# Patient Record
Sex: Male | Born: 1990 | Race: White | Hispanic: No | Smoking: Current every day smoker
Health system: Southern US, Community
[De-identification: ages and names within clinical notes are randomized; demographics above are authoritative.]

## PROBLEM LIST (undated history)

## (undated) DIAGNOSIS — L0591 Pilonidal cyst without abscess: Secondary | ICD-10-CM

## (undated) DIAGNOSIS — Z87820 Personal history of traumatic brain injury: Secondary | ICD-10-CM

## (undated) HISTORY — PX: TOOTH EXTRACTION: SHX859

---

## 2010-08-28 DIAGNOSIS — Z87820 Personal history of traumatic brain injury: Secondary | ICD-10-CM

## 2010-08-28 HISTORY — DX: Personal history of traumatic brain injury: Z87.820

## 2013-04-25 ENCOUNTER — Ambulatory Visit (INDEPENDENT_AMBULATORY_CARE_PROVIDER_SITE_OTHER): Payer: 59 | Admitting: Surgery

## 2013-04-25 ENCOUNTER — Encounter (INDEPENDENT_AMBULATORY_CARE_PROVIDER_SITE_OTHER): Payer: Self-pay | Admitting: Surgery

## 2013-04-25 VITALS — BP 130/78 | HR 78 | Temp 98.4°F | Resp 15 | Ht 73.0 in | Wt 181.4 lb

## 2013-04-25 DIAGNOSIS — R590 Localized enlarged lymph nodes: Secondary | ICD-10-CM | POA: Insufficient documentation

## 2013-04-25 DIAGNOSIS — R599 Enlarged lymph nodes, unspecified: Secondary | ICD-10-CM

## 2013-04-25 DIAGNOSIS — L0591 Pilonidal cyst without abscess: Secondary | ICD-10-CM

## 2013-04-25 NOTE — Progress Notes (Signed)
Patient ID: Austin Schmidt, male   DOB: 1990-10-20, 22 y.o.   MRN: [id number]  Chief Complaint  Patient presents with  . New Evaluation    eval cyst on bottom of back /  lymphnode at left neck    HPI Austin Schmidt is a 22 y.o. male.  Patient sent at the request of Dr. Sw[place]il[place] for pilonidal disease and enlarged left supraclavicular node.  Cyst drained 10 days ago and much better.  No cough or recent infections. No weight loss.  HPI  History reviewed. No pertinent past medical history.  History reviewed. No pertinent past surgical history.  History reviewed. No pertinent family history.  Social History History  Substance Use Topics  . Smoking status: Current Every Day Smoker -- 1.00 packs/day  . Smokeless tobacco: Never Used  . Alcohol Use: No    Not on File  Current Outpatient Prescriptions  Medication Sig Dispense Refill  . amoxicillin-clavulanate (AUGMENTIN) 125-31.25 MG/5ML suspension Take by mouth 2 (two) times daily.       No current facility-administered medications for this visit.    Review of Systems Review of Systems  Constitutional: Negative for fever.  HENT: Negative.   Eyes: Negative.   Respiratory: Negative.   Cardiovascular: Negative.   Gastrointestinal: Negative.   Endocrine: Negative.   Genitourinary: Negative.   Allergic/Immunologic: Negative.   Neurological: Negative.   Hematological: Positive for adenopathy.  Psychiatric/Behavioral: Negative.     Blood pressure 130/78, pulse 78, temperature 98.4 F (36.9 C), temperature source Temporal, resp. rate 15, height 6\' 1"  (1.854 m), weight 181 lb 6.4 oz (82.283 kg).  Physical Exam Physical Exam  Constitutional: He is oriented to person, place, and time. He appears well-developed and well-nourished.  HENT:  Head: Normocephalic and atraumatic.  Eyes: EOM are normal. Pupils are equal, round, and reactive to light.  Pulmonary/Chest: Effort normal.  Musculoskeletal: Normal range of motion.    Lymphadenopathy:    He has no cervical adenopathy.       Left: Supraclavicular adenopathy present.  Neurological: He is alert and oriented to person, place, and time.  Skin:     Psychiatric: He has a normal mood and affect. His behavior is normal. Judgment normal.    Data Reviewed Dr Virginia Crews notes  Assessment    Pilonidal cyst with history of infection Supraclavicular adenopathy left mild    Plan    Pilonidal cyst  Not infected Mild lymphadenopathy follow up next month       Austin Schmidt A. 04/25/2013, 11:34 AM

## 2013-04-25 NOTE — Patient Instructions (Signed)
Swollen Lymph Nodes The lymphatic system filters fluid from around cells. It is like a system of blood vessels. These channels carry lymph instead of blood. The lymphatic system is an important part of the immune (disease fighting) system. When people talk about "swollen glands in the neck," they are usually talking about swollen lymph nodes. The lymph nodes are like the little traps for infection. You and your caregiver may be able to feel lymph nodes, especially swollen nodes, in these common areas: the groin (inguinal area), armpits (axilla), and above the clavicle (supraclavicular). You may also feel them in the neck (cervical) and the back of the head just above the hairline (occipital). Swollen glands occur when there is any condition in which the body responds with an allergic type of reaction. For instance, the glands in the neck can become swollen from insect bites or any type of minor infection on the head. These are very noticeable in children with only minor problems. Lymph nodes may also become swollen when there is a tumor or problem with the lymphatic system, such as Hodgkin's disease. TREATMENT   Most swollen glands do not require treatment. They can be observed (watched) for a short period of time, if your caregiver feels it is necessary. Most of the time, observation is not necessary.  Antibiotics (medicines that kill germs) may be prescribed by your caregiver. Your caregiver may prescribe these if he or she feels the swollen glands are due to a bacterial (germ) infection. Antibiotics are not used if the swollen glands are caused by a virus. HOME CARE INSTRUCTIONS   Take medications as directed by your caregiver. Only take over-the-counter or prescription medicines for pain, discomfort, or fever as directed by your caregiver. SEEK MEDICAL CARE IF:   If you begin to run a temperature greater than 102 F (38.9 C), or as your caregiver suggests. MAKE SURE YOU:   Understand these  instructions.  Will watch your condition.  Will get help right away if you are not doing well or get worse. Document Released: 08/04/2002 Document Revised: 11/06/2011 Document Reviewed: 08/14/2005 [facility] Patient Information 2014 [place], [place]. Pilonidal Cyst A pilonidal cyst occurs when hairs get trapped (ingrown) beneath the skin in the crease between the buttocks over your sacrum (the bone under that crease). Pilonidal cysts are most common in young men with a lot of body hair. When the cyst is ruptured (breaks) or leaking, fluid from the cyst may cause burning and itching. If the cyst becomes infected, it causes a painful swelling filled with pus (abscess). The pus and trapped hairs need to be removed (often by lancing) so that the infection can heal. However, recurrence is common and an operation may be needed to remove the cyst. HOME CARE INSTRUCTIONS   If the cyst was NOT INFECTED:  Keep the area clean and dry. Bathe or shower daily. Wash the area well with a germ-killing soap. Warm tub baths may help prevent infection and help with drainage. Dry the area well with a towel.  Avoid tight clothing to keep area as moisture free as possible.  Keep area between buttocks as free of hair as possible. A depilatory may be used.  If the cyst WAS INFECTED and needed to be drained:  Your caregiver packed the wound with gauze to keep the wound open. This allows the wound to heal from the inside outwards and continue draining.  Return for a wound check in 1 day or as suggested.  If you take tub baths  or showers, repack the wound with gauze following them. Sponge baths (at the sink) are a good alternative.  If an antibiotic was ordered to fight the infection, take as directed.  Only take over-the-counter or prescription medicines for pain, discomfort, or fever as directed by your caregiver.  After the drain is removed, use sitz baths for 20 minutes 4 times per day. Clean the wound gently  with mild unscented soap, pat dry, and then apply a dry dressing. SEEK MEDICAL CARE IF:   You have increased pain, swelling, redness, drainage, or bleeding from the area.  You have a fever.  You have muscles aches, dizziness, or a general ill feeling. Document Released: 08/11/2000 Document Revised: 11/06/2011 Document Reviewed: 10/09/2008 [facility] Patient Information 2014 [place], [place].

## 2013-05-22 ENCOUNTER — Encounter (INDEPENDENT_AMBULATORY_CARE_PROVIDER_SITE_OTHER): Payer: Self-pay | Admitting: Surgery

## 2013-05-22 ENCOUNTER — Ambulatory Visit (INDEPENDENT_AMBULATORY_CARE_PROVIDER_SITE_OTHER): Payer: 59 | Admitting: Surgery

## 2013-05-22 VITALS — BP 110/76 | HR 64 | Temp 98.0°F | Resp 16 | Ht 73.0 in | Wt 179.6 lb

## 2013-05-22 DIAGNOSIS — L0591 Pilonidal cyst without abscess: Secondary | ICD-10-CM

## 2013-05-22 DIAGNOSIS — R599 Enlarged lymph nodes, unspecified: Secondary | ICD-10-CM

## 2013-05-22 DIAGNOSIS — R590 Localized enlarged lymph nodes: Secondary | ICD-10-CM

## 2013-05-22 NOTE — Patient Instructions (Signed)
Stop smoking.  Return 6 weeks for recheck of node and pilonidal.

## 2013-05-22 NOTE — Progress Notes (Signed)
Patient ID: Austin Schmidt, male   DOB: 02-10-1991, 22 y.o.   MRN: [id number]  Chief Complaint  Patient presents with  . Follow-up    reck cyst ?pilo    HPI Austin Schmidt is a 22 y.o. male.  Patient sent at the request of Dr. Sw[place] for pilonidal disease and enlarged left supraclavicular node.  Cyst drained 10 days ago and much better.  No cough or recent infections. No weight loss.  Here for 1 month follow up.  No new complaints. HPI  History reviewed. No pertinent past medical history.  History reviewed. No pertinent past surgical history.  History reviewed. No pertinent family history.  Social History History  Substance Use Topics  . Smoking status: Current Every Day Smoker -- 1.00 packs/day  . Smokeless tobacco: Never Used  . Alcohol Use: No    No Known Allergies  No current outpatient prescriptions on file.   No current facility-administered medications for this visit.    Review of Systems Review of Systems  Constitutional: Negative for fever.  HENT: Negative.   Eyes: Negative.   Respiratory: Negative.   Cardiovascular: Negative.   Gastrointestinal: Negative.   Endocrine: Negative.   Genitourinary: Negative.   Allergic/Immunologic: Negative.   Neurological: Negative.   Hematological: Positive for adenopathy.  Psychiatric/Behavioral: Negative.     Blood pressure 110/76, pulse 64, temperature 98 F (36.7 C), temperature source Oral, resp. rate 16, height 6\' 1"  (1.854 m), weight 179 lb 9.6 oz (81.466 kg).  Physical Exam Physical Exam  Constitutional: He is oriented to person, place, and time. He appears well-developed and well-nourished.  HENT:  Head: Normocephalic and atraumatic.  Eyes: EOM are normal. Pupils are equal, round, and reactive to light.  Pulmonary/Chest: Effort normal.  Musculoskeletal: Normal range of motion.  Lymphadenopathy:    He has no cervical adenopathy.       Left: Supraclavicular adenopathy present.  1 cm in size stable to  slightly smaller. Neurological: He is alert and oriented to person, place, and time.  Skin:     Psychiatric: He has a normal mood and affect. His behavior is normal. Judgment normal.    Data Reviewed Dr Virginia Crews notes  Assessment    Pilonidal cyst with history of infection Supraclavicular adenopathy left mild    Plan    Pilonidal cyst  Not infected Mild lymphadenopathy mild improvement in size.   Recheck in 6 weeks. Recommend smoking cessation if he wants pilonidal removed.         Austin Schmidt A. 05/22/2013, 2:47 PM

## 2013-06-30 ENCOUNTER — Ambulatory Visit (INDEPENDENT_AMBULATORY_CARE_PROVIDER_SITE_OTHER): Payer: 59 | Admitting: Surgery

## 2013-06-30 ENCOUNTER — Encounter (INDEPENDENT_AMBULATORY_CARE_PROVIDER_SITE_OTHER): Payer: Self-pay | Admitting: Surgery

## 2013-06-30 VITALS — BP 110/64 | HR 72 | Temp 97.6°F | Resp 14 | Ht 73.0 in | Wt 182.4 lb

## 2013-06-30 DIAGNOSIS — L0591 Pilonidal cyst without abscess: Secondary | ICD-10-CM

## 2013-06-30 DIAGNOSIS — R599 Enlarged lymph nodes, unspecified: Secondary | ICD-10-CM

## 2013-06-30 DIAGNOSIS — R590 Localized enlarged lymph nodes: Secondary | ICD-10-CM

## 2013-06-30 MED ORDER — DOXYCYCLINE HYCLATE 50 MG PO CAPS: 100.0000 mg | ORAL_CAPSULE | Freq: Two times a day (BID) | ORAL | Status: DC

## 2013-06-30 NOTE — Patient Instructions (Signed)
TAKE ANTIBIOTICS UNTIL GONE.  RETURN 1 MONTH.

## 2013-06-30 NOTE — Progress Notes (Signed)
Patient ID: Austin Schmidt, male   DOB: June 29, 1991, 22 y.o.   MRN: [id number]  Chief Complaint  Patient presents with  . Routine Post Op    reck pilo cyst    HPI Austin Schmidt is a 22 y.o. male.  Patient sent at the request of Dr. [place] for pilonidal disease and enlarged left supraclavicular node.  Cyst drained 10 days ago and much better.  No cough or recent infections. No weight loss.  Here for 1 month follow up.  No new complaints. HPI  History reviewed. No pertinent past medical history.  History reviewed. No pertinent past surgical history.  History reviewed. No pertinent family history.  Social History History  Substance Use Topics  . Smoking status: Former Smoker -- 1.00 packs/day    Quit date: 06/16/2013  . Smokeless tobacco: Never Used  . Alcohol Use: No    No Known Allergies  Current Outpatient Prescriptions  Medication Sig Dispense Refill  . doxycycline (VIBRAMYCIN) 50 MG capsule Take 2 capsules (100 mg total) by mouth 2 (two) times daily.  32 capsule  0   No current facility-administered medications for this visit.    Review of Systems Review of Systems  Constitutional: Negative for fever.  HENT: Negative.   Eyes: Negative.   Respiratory: Negative.   Cardiovascular: Negative.   Gastrointestinal: Negative.   Endocrine: Negative.   Genitourinary: Negative.   Allergic/Immunologic: Negative.   Neurological: Negative.   Hematological: Positive for adenopathy.  Psychiatric/Behavioral: Negative.     Blood pressure 110/64, pulse 72, temperature 97.6 F (36.4 C), temperature source Temporal, resp. rate 14, height 6\' 1"  (1.854 m), weight 182 lb 6.4 oz (82.736 kg).  Physical Exam Physical Exam  Constitutional: He is oriented to person, place, and time. He appears well-developed and well-nourished.  HENT:  Head: Normocephalic and atraumatic.  Eyes: EOM are normal. Pupils are equal, round, and reactive to light.  Pulmonary/Chest: Effort normal.   Musculoskeletal: Normal range of motion.  Lymphadenopathy:    He has no cervical adenopathy.       Left: Supraclavicular adenopathy present.  1 cm in size stable to slightly smaller. Neurological: He is alert and oriented to person, place, and time.  Skin:     Psychiatric: He has a normal mood and affect. His behavior is normal. Judgment normal.    Data Reviewed Dr Virginia Crews notes  Assessment    Pilonidal cyst with history of infection Supraclavicular adenopathy left mild    Plan    Pilonidal cyst  Not infected Mild lymphadenopathy mild improvement in size.   Recheck in 4 weeks. Recommend smoking cessation if he wants pilonidal removed.         Hermelinda Diegel A. 06/30/2013, 2:57 PM

## 2013-07-28 ENCOUNTER — Encounter (INDEPENDENT_AMBULATORY_CARE_PROVIDER_SITE_OTHER): Payer: 59 | Admitting: Surgery

## 2013-08-05 ENCOUNTER — Encounter (INDEPENDENT_AMBULATORY_CARE_PROVIDER_SITE_OTHER): Payer: Self-pay | Admitting: Surgery

## 2013-08-05 ENCOUNTER — Ambulatory Visit (INDEPENDENT_AMBULATORY_CARE_PROVIDER_SITE_OTHER): Payer: 59 | Admitting: Surgery

## 2013-08-05 VITALS — BP 127/80 | HR 78 | Temp 99.1°F | Resp 18 | Ht 73.0 in | Wt 186.4 lb

## 2013-08-05 DIAGNOSIS — L0591 Pilonidal cyst without abscess: Secondary | ICD-10-CM

## 2013-08-05 NOTE — Patient Instructions (Signed)
Pilonidal Cyst A pilonidal cyst occurs when hairs get trapped (ingrown) beneath the skin in the crease between the buttocks over your sacrum (the bone under that crease). Pilonidal cysts are most common in young men with a lot of body hair. When the cyst is ruptured (breaks) or leaking, fluid from the cyst may cause burning and itching. If the cyst becomes infected, it causes a painful swelling filled with pus (abscess). The pus and trapped hairs need to be removed (often by lancing) so that the infection can heal. However, recurrence is common and an operation may be needed to remove the cyst. HOME CARE INSTRUCTIONS   If the cyst was NOT INFECTED:  Keep the area clean and dry. Bathe or shower daily. Wash the area well with a germ-killing soap. Warm tub baths may help prevent infection and help with drainage. Dry the area well with a towel.  Avoid tight clothing to keep area as moisture free as possible.  Keep area between buttocks as free of hair as possible. A depilatory may be used.  If the cyst WAS INFECTED and needed to be drained:  Your caregiver packed the wound with gauze to keep the wound open. This allows the wound to heal from the inside outwards and continue draining.  Return for a wound check in 1 day or as suggested.  If you take tub baths or showers, repack the wound with gauze following them. Sponge baths (at the sink) are a good alternative.  If an antibiotic was ordered to fight the infection, take as directed.  Only take over-the-counter or prescription medicines for pain, discomfort, or fever as directed by your caregiver.  After the drain is removed, use sitz baths for 20 minutes 4 times per day. Clean the wound gently with mild unscented soap, pat dry, and then apply a dry dressing. SEEK MEDICAL CARE IF:   You have increased pain, swelling, redness, drainage, or bleeding from the area.  You have a fever.  You have muscles aches, dizziness, or a general ill  feeling. Document Released: 08/11/2000 Document Revised: 11/06/2011 Document Reviewed: 10/09/2008 [employer] Patient Information 2014 [employer].  

## 2013-08-05 NOTE — Progress Notes (Signed)
Patient ID: Austin Schmidt, male   DOB: 02-Dec-1990, 21 y.o.   MRN: [id number]  Chief Complaint  Patient presents with  . Follow-up    HPI Austin Schmidt is a 22 y.o. male.  Patient sent at the request of Dr. [place] for pilonidal disease and enlarged left supraclavicular node.   No cough or recent infections. No weight loss.  Here for 1 month follow up.  No new complaints. Trying to stop smoking. No new complaints.  HPI  History reviewed. No pertinent past medical history.  History reviewed. No pertinent past surgical history.  History reviewed. No pertinent family history.  Social History History  Substance Use Topics  . Smoking status: Former Smoker -- 1.00 packs/day    Quit date: 06/16/2013  . Smokeless tobacco: Never Used  . Alcohol Use: No    No Known Allergies  No current outpatient prescriptions on file.   No current facility-administered medications for this visit.    Review of Systems Review of Systems  Constitutional: Negative for fever.  HENT: Negative.   Eyes: Negative.   Respiratory: Negative.   Cardiovascular: Negative.   Gastrointestinal: Negative.   Endocrine: Negative.   Genitourinary: Negative.   Allergic/Immunologic: Negative.   Neurological: Negative.   Hematological: Positive for adenopathy.  Psychiatric/Behavioral: Negative.     Blood pressure 127/80, pulse 78, temperature 99.1 F (37.3 C), temperature source Temporal, resp. rate 18, height 6\' 1"  (1.854 m), weight 186 lb 6.4 oz (84.55 kg).  Physical Exam Physical Exam  Constitutional: He is oriented to person, place, and time. He appears well-developed and well-nourished.  HENT:  Head: Normocephalic and atraumatic.  Eyes: EOM are normal. Pupils are equal, round, and reactive to light.  Pulmonary/Chest: Effort normal.  Musculoskeletal: Normal range of motion.  Lymphadenopathy:    He has no cervical adenopathy.       Left: Supraclavicular adenopathy resolved  0.5 cm in size stable to  slightly smaller. Neurological: He is alert and oriented to person, place, and time.  Skin:  2 x 3 cm pilonidal cyst not infected   Psychiatric: He has a normal mood and affect. His behavior is normal. Judgment normal.    Data Reviewed Dr Virginia Crews notes  Assessment    Pilonidal cyst with history of infection Supraclavicular adenopathy left mild    Plan    Pilonidal cyst  Not infected wants to set up surgery.  The procedure has been discussed with the patient.  Alternative therapies have been discussed with the patient.  Operative risks include bleeding,  Infection,  Organ injury,  Nerve injury,  Blood vessel injury,  DVT,  Pulmonary embolism,  Death, poor wound healing and reoperation.   Medical management risks include worsening of present situation.  The success of the procedure is 50 -90 % at treating patients symptoms.  The patient understands and agrees to proceed. Mild lymphadenopathy left supraclavicular area  resolved Recommend smoking cessation if he wants pilonidal removed.         Hollynn Garno A. 08/05/2013, 5:14 PM

## 2013-09-28 DIAGNOSIS — L0591 Pilonidal cyst without abscess: Secondary | ICD-10-CM

## 2013-09-28 HISTORY — DX: Pilonidal cyst without abscess: L05.91

## 2013-10-16 ENCOUNTER — Encounter (HOSPITAL_BASED_OUTPATIENT_CLINIC_OR_DEPARTMENT_OTHER): Payer: Self-pay | Admitting: *Deleted

## 2013-10-16 NOTE — Pre-Procedure Instructions (Signed)
To come for CBC, diff, CMET, CXR

## 2013-10-20 ENCOUNTER — Encounter (HOSPITAL_BASED_OUTPATIENT_CLINIC_OR_DEPARTMENT_OTHER)
Admission: RE | Admit: 2013-10-20 | Discharge: 2013-10-20 | Disposition: A | Discharge status: Home / Self Care | Payer: 59 | Source: Ambulatory Visit | Attending: Surgery | Admitting: Surgery

## 2013-10-20 ENCOUNTER — Other Ambulatory Visit (INDEPENDENT_AMBULATORY_CARE_PROVIDER_SITE_OTHER): Payer: Self-pay | Admitting: Surgery

## 2013-10-20 ENCOUNTER — Ambulatory Visit
Admission: RE | Admit: 2013-10-20 | Discharge: 2013-10-20 | Disposition: A | Discharge status: Home / Self Care | Payer: 59 | Source: Ambulatory Visit | Attending: Surgery | Admitting: Surgery

## 2013-10-20 DIAGNOSIS — Z01818 Encounter for other preprocedural examination: Secondary | ICD-10-CM

## 2013-10-20 LAB — CBC WITH DIFFERENTIAL/PLATELET
Basophils Absolute: 0 10*3/uL (ref 0.0–0.1)
Basophils Relative: 0 % (ref 0–1)
EOS ABS: 0.1 10*3/uL (ref 0.0–0.7)
EOS PCT: 2 % (ref 0–5)
HCT: 42.6 % (ref 39.0–52.0)
HEMOGLOBIN: 14.9 g/dL (ref 13.0–17.0)
LYMPHS ABS: 1.9 10*3/uL (ref 0.7–4.0)
Lymphocytes Relative: 34 % (ref 12–46)
MCH: 31.7 pg (ref 26.0–34.0)
MCHC: 35 g/dL (ref 30.0–36.0)
MCV: 90.6 fL (ref 78.0–100.0)
MONOS PCT: 8 % (ref 3–12)
Monocytes Absolute: 0.5 10*3/uL (ref 0.1–1.0)
Neutro Abs: 3.1 10*3/uL (ref 1.7–7.7)
Neutrophils Relative %: 55 % (ref 43–77)
Platelets: 234 10*3/uL (ref 150–400)
RBC: 4.7 MIL/uL (ref 4.22–5.81)
RDW: 12.6 % (ref 11.5–15.5)
WBC: 5.6 10*3/uL (ref 4.0–10.5)

## 2013-10-20 LAB — COMPREHENSIVE METABOLIC PANEL
ALT: 34 U/L (ref 0–53)
AST: 23 U/L (ref 0–37)
Albumin: 4.1 g/dL (ref 3.5–5.2)
Alkaline Phosphatase: 65 U/L (ref 39–117)
BUN: 12 mg/dL (ref 6–23)
CALCIUM: 9.6 mg/dL (ref 8.4–10.5)
CO2: 27 mEq/L (ref 19–32)
CREATININE: 0.94 mg/dL (ref 0.50–1.35)
Chloride: 106 mEq/L (ref 96–112)
GFR calc non Af Amer: >90 mL/min (ref >90)
GLUCOSE: 96 mg/dL (ref 70–99)
Potassium: 4.3 mEq/L (ref 3.7–5.3)
Sodium: 147 mEq/L (ref 137–147)
Total Bilirubin: 0.8 mg/dL (ref 0.3–1.2)
Total Protein: 7.3 g/dL (ref 6.0–8.3)

## 2013-10-21 ENCOUNTER — Encounter (HOSPITAL_BASED_OUTPATIENT_CLINIC_OR_DEPARTMENT_OTHER): Payer: 59 | Admitting: Certified Registered"

## 2013-10-21 ENCOUNTER — Encounter (HOSPITAL_BASED_OUTPATIENT_CLINIC_OR_DEPARTMENT_OTHER)
Admission: RE | Disposition: A | Discharge status: Home / Self Care | Payer: Self-pay | Source: Ambulatory Visit | Attending: Surgery

## 2013-10-21 ENCOUNTER — Ambulatory Visit (HOSPITAL_BASED_OUTPATIENT_CLINIC_OR_DEPARTMENT_OTHER)
Admission: RE | Admit: 2013-10-21 | Discharge: 2013-10-21 | Disposition: A | Discharge status: Home / Self Care | Payer: 59 | Source: Ambulatory Visit | Attending: Surgery | Admitting: Surgery

## 2013-10-21 ENCOUNTER — Ambulatory Visit (HOSPITAL_BASED_OUTPATIENT_CLINIC_OR_DEPARTMENT_OTHER): Payer: 59 | Admitting: Certified Registered"

## 2013-10-21 ENCOUNTER — Encounter (HOSPITAL_BASED_OUTPATIENT_CLINIC_OR_DEPARTMENT_OTHER): Payer: Self-pay | Admitting: Certified Registered"

## 2013-10-21 DIAGNOSIS — L0591 Pilonidal cyst without abscess: Secondary | ICD-10-CM | POA: Insufficient documentation

## 2013-10-21 DIAGNOSIS — Z87891 Personal history of nicotine dependence: Secondary | ICD-10-CM | POA: Insufficient documentation

## 2013-10-21 DIAGNOSIS — R599 Enlarged lymph nodes, unspecified: Secondary | ICD-10-CM | POA: Insufficient documentation

## 2013-10-21 HISTORY — PX: PILONIDAL CYST EXCISION: SHX744

## 2013-10-21 HISTORY — DX: Personal history of traumatic brain injury: Z87.820

## 2013-10-21 HISTORY — DX: Pilonidal cyst without abscess: L05.91

## 2013-10-21 SURGERY — EXCISION, SIMPLE PILONIDAL CYST
Anesthesia: General | Site: Coccyx

## 2013-10-21 MED ORDER — OXYCODONE-ACETAMINOPHEN 5-325 MG PO TABS: 1.0000 | ORAL_TABLET | ORAL | Status: DC | PRN

## 2013-10-21 MED ORDER — SUCCINYLCHOLINE CHLORIDE 20 MG/ML IJ SOLN
INTRAMUSCULAR | Status: DC | PRN
  Administered 2013-10-21: 120 mg via INTRAVENOUS

## 2013-10-21 MED ORDER — CEFAZOLIN SODIUM-DEXTROSE 2-3 GM-% IV SOLR
INTRAVENOUS | Status: AC
  Filled 2013-10-21: qty 50

## 2013-10-21 MED ORDER — CHLORHEXIDINE GLUCONATE 4 % EX LIQD: 1.0000 "application " | Freq: Once | CUTANEOUS | Status: DC

## 2013-10-21 MED ORDER — LIDOCAINE HCL (CARDIAC) 20 MG/ML IV SOLN
INTRAVENOUS | Status: DC | PRN
  Administered 2013-10-21: 60 mg via INTRAVENOUS

## 2013-10-21 MED ORDER — SUCCINYLCHOLINE CHLORIDE 20 MG/ML IJ SOLN
INTRAMUSCULAR | Status: AC
  Filled 2013-10-21: qty 1

## 2013-10-21 MED ORDER — HYDROMORPHONE HCL PF 1 MG/ML IJ SOLN
0.2500 mg | INTRAMUSCULAR | Status: DC | PRN
  Administered 2013-10-21 (×2): 0.5 mg via INTRAVENOUS

## 2013-10-21 MED ORDER — PROPOFOL 10 MG/ML IV BOLUS
INTRAVENOUS | Status: DC | PRN
Start: 2013-10-21 — End: 2013-10-21
  Administered 2013-10-21: 200 mg via INTRAVENOUS

## 2013-10-21 MED ORDER — MIDAZOLAM HCL 2 MG/ML PO SYRP: 12.0000 mg | ORAL_SOLUTION | Freq: Once | ORAL | Status: DC | PRN

## 2013-10-21 MED ORDER — FENTANYL CITRATE 0.05 MG/ML IJ SOLN
INTRAMUSCULAR | Status: AC
  Filled 2013-10-21: qty 4

## 2013-10-21 MED ORDER — CEFAZOLIN SODIUM-DEXTROSE 2-3 GM-% IV SOLR
INTRAVENOUS | Status: DC | PRN
  Administered 2013-10-21: 2 g via INTRAVENOUS

## 2013-10-21 MED ORDER — MIDAZOLAM HCL 5 MG/5ML IJ SOLN
INTRAMUSCULAR | Status: DC | PRN
  Administered 2013-10-21: 2 mg via INTRAVENOUS

## 2013-10-21 MED ORDER — HYDROMORPHONE HCL PF 1 MG/ML IJ SOLN
INTRAMUSCULAR | Status: AC
  Filled 2013-10-21: qty 1

## 2013-10-21 MED ORDER — OXYCODONE HCL 5 MG/5ML PO SOLN
5.0000 mg | Freq: Once | ORAL | Status: DC | PRN
Start: 2013-10-21 — End: 2013-10-21

## 2013-10-21 MED ORDER — LACTATED RINGERS IV SOLN
INTRAVENOUS | Status: DC
  Administered 2013-10-21: 13:00:00 via INTRAVENOUS

## 2013-10-21 MED ORDER — ONDANSETRON HCL 4 MG/2ML IJ SOLN
INTRAMUSCULAR | Status: DC | PRN
Start: 2013-10-21 — End: 2013-10-21
  Administered 2013-10-21: 4 mg via INTRAVENOUS

## 2013-10-21 MED ORDER — DEXTROSE 5 % IV SOLN: 3.0000 g | INTRAVENOUS | Status: DC

## 2013-10-21 MED ORDER — PROPOFOL 10 MG/ML IV EMUL
INTRAVENOUS | Status: AC
  Filled 2013-10-21: qty 50

## 2013-10-21 MED ORDER — FENTANYL CITRATE 0.05 MG/ML IJ SOLN
INTRAMUSCULAR | Status: DC | PRN
  Administered 2013-10-21: 100 ug via INTRAVENOUS
  Administered 2013-10-21 (×2): 50 ug via INTRAVENOUS

## 2013-10-21 MED ORDER — BACITRACIN-NEOMYCIN-POLYMYXIN OINTMENT TUBE
TOPICAL_OINTMENT | CUTANEOUS | Status: DC | PRN
  Administered 2013-10-21: 1 via TOPICAL

## 2013-10-21 MED ORDER — BUPIVACAINE-EPINEPHRINE PF 0.25-1:200000 % IJ SOLN
INTRAMUSCULAR | Status: AC
  Filled 2013-10-21: qty 30

## 2013-10-21 MED ORDER — BACITRACIN-NEOMYCIN-POLYMYXIN 400-5-5000 EX OINT
TOPICAL_OINTMENT | CUTANEOUS | Status: AC
  Filled 2013-10-21: qty 1

## 2013-10-21 MED ORDER — DEXAMETHASONE SODIUM PHOSPHATE 4 MG/ML IJ SOLN
INTRAMUSCULAR | Status: DC | PRN
  Administered 2013-10-21: 10 mg via INTRAVENOUS

## 2013-10-21 MED ORDER — MIDAZOLAM HCL 2 MG/2ML IJ SOLN
INTRAMUSCULAR | Status: AC
Start: 2013-10-21 — End: 2013-10-21
  Filled 2013-10-21: qty 2

## 2013-10-21 MED ORDER — PROMETHAZINE HCL 25 MG/ML IJ SOLN: 6.2500 mg | INTRAMUSCULAR | Status: DC | PRN

## 2013-10-21 MED ORDER — FENTANYL CITRATE 0.05 MG/ML IJ SOLN: 50.0000 ug | INTRAMUSCULAR | Status: DC | PRN

## 2013-10-21 MED ORDER — MIDAZOLAM HCL 2 MG/2ML IJ SOLN: 1.0000 mg | INTRAMUSCULAR | Status: DC | PRN

## 2013-10-21 MED ORDER — OXYCODONE HCL 5 MG PO TABS: 5.0000 mg | ORAL_TABLET | Freq: Once | ORAL | Status: DC | PRN

## 2013-10-21 MED ORDER — BUPIVACAINE-EPINEPHRINE 0.25% -1:200000 IJ SOLN
INTRAMUSCULAR | Status: DC | PRN
  Administered 2013-10-21: 20 mL

## 2013-10-21 SURGICAL SUPPLY — 49 items
BENZOIN TINCTURE PRP APPL 2/3 (GAUZE/BANDAGES/DRESSINGS) IMPLANT
BLADE HEX COATED 2.75 (ELECTRODE) ×3 IMPLANT
BLADE SURG 10 STRL SS (BLADE) IMPLANT
BLADE SURG 15 STRL LF DISP TIS (BLADE) ×1 IMPLANT
BLADE SURG 15 STRL SS (BLADE) ×2
BLADE SURG ROTATE 9660 (MISCELLANEOUS) ×3 IMPLANT
CANISTER SUCT 1200ML W/VALVE (MISCELLANEOUS) ×3 IMPLANT
CHLORAPREP W/TINT 26ML (MISCELLANEOUS) ×3 IMPLANT
COVER MAYO STAND STRL (DRAPES) ×3 IMPLANT
COVER TABLE BACK 60X90 (DRAPES) ×3 IMPLANT
DECANTER SPIKE VIAL GLASS SM (MISCELLANEOUS) IMPLANT
DERMABOND ADVANCED (GAUZE/BANDAGES/DRESSINGS)
DERMABOND ADVANCED .7 DNX12 (GAUZE/BANDAGES/DRESSINGS) IMPLANT
DRAPE LAPAROTOMY T 102X78X121 (DRAPES) ×3 IMPLANT
DRAPE PED LAPAROTOMY (DRAPES) ×3 IMPLANT
DRAPE UTILITY XL STRL (DRAPES) ×3 IMPLANT
ELECT REM PT RETURN 9FT ADLT (ELECTROSURGICAL) ×3
ELECTRODE REM PT RTRN 9FT ADLT (ELECTROSURGICAL) ×1 IMPLANT
GAUZE PACKING IODOFORM 1/4X15 (GAUZE/BANDAGES/DRESSINGS) ×3 IMPLANT
GLOVE BIO SURGEON STRL SZ 6.5 (GLOVE) ×2 IMPLANT
GLOVE BIO SURGEONS STRL SZ 6.5 (GLOVE) ×1
GLOVE BIOGEL PI IND STRL 7.0 (GLOVE) ×1 IMPLANT
GLOVE BIOGEL PI IND STRL 8 (GLOVE) ×1 IMPLANT
GLOVE BIOGEL PI IND STRL 8.5 (GLOVE) ×1 IMPLANT
GLOVE BIOGEL PI INDICATOR 7.0 (GLOVE) ×2
GLOVE BIOGEL PI INDICATOR 8 (GLOVE) ×2
GLOVE BIOGEL PI INDICATOR 8.5 (GLOVE) ×2
GLOVE ECLIPSE 8.0 STRL XLNG CF (GLOVE) ×3 IMPLANT
GOWN STRL REUS W/ TWL LRG LVL3 (GOWN DISPOSABLE) ×2 IMPLANT
GOWN STRL REUS W/TWL LRG LVL3 (GOWN DISPOSABLE) ×4
NEEDLE HYPO 25X1 1.5 SAFETY (NEEDLE) ×3 IMPLANT
NS IRRIG 1000ML POUR BTL (IV SOLUTION) ×3 IMPLANT
PACK BASIN DAY SURGERY FS (CUSTOM PROCEDURE TRAY) ×3 IMPLANT
PAD ABD 8X10 STRL (GAUZE/BANDAGES/DRESSINGS) ×3 IMPLANT
PENCIL BUTTON HOLSTER BLD 10FT (ELECTRODE) ×3 IMPLANT
SPONGE GAUZE 4X4 12PLY STER LF (GAUZE/BANDAGES/DRESSINGS) ×3 IMPLANT
SPONGE LAP 4X18 X RAY DECT (DISPOSABLE) ×3 IMPLANT
STAPLER VISISTAT 35W (STAPLE) IMPLANT
SUT MNCRL AB 3-0 PS2 18 (SUTURE) ×3 IMPLANT
SUT VIC AB 2-0 SH 27 (SUTURE) ×4
SUT VIC AB 2-0 SH 27XBRD (SUTURE) ×2 IMPLANT
SYR CONTROL 10ML LL (SYRINGE) ×3 IMPLANT
TAPE CLOTH 3X10 TAN LF (GAUZE/BANDAGES/DRESSINGS) ×3 IMPLANT
TOWEL OR 17X24 6PK STRL BLUE (TOWEL DISPOSABLE) ×3 IMPLANT
TOWEL OR NON WOVEN STRL DISP B (DISPOSABLE) IMPLANT
TUBE CONNECTING 20'X1/4 (TUBING) ×1
TUBE CONNECTING 20X1/4 (TUBING) ×2 IMPLANT
UNDERPAD 30X30 INCONTINENT (UNDERPADS AND DIAPERS) ×3 IMPLANT
YANKAUER SUCT BULB TIP NO VENT (SUCTIONS) ×3 IMPLANT

## 2013-10-21 NOTE — Anesthesia Preprocedure Evaluation (Signed)
Anesthesia Evaluation  Patient identified by MRN, date of birth, ID band Patient awake    Reviewed: Allergy & Precautions, H&P , NPO status , Patient's Chart, lab work & pertinent test results  History of Anesthesia Complications Negative for: history of anesthetic complications  Airway Mallampati: I  Neck ROM: Full    Dental no notable dental hx. (+) Teeth Intact   Pulmonary neg pulmonary ROS, Current Smoker,  breath sounds clear to auscultation  Pulmonary exam normal       Cardiovascular negative cardio ROS  IRhythm:Regular Rate:Normal     Neuro/Psych negative neurological ROS  negative psych ROS   GI/Hepatic negative GI ROS, Neg liver ROS,   Endo/Other  negative endocrine ROS  Renal/GU negative Renal ROS  negative genitourinary   Musculoskeletal   Abdominal   Peds  Hematology negative hematology ROS (+)   Anesthesia Other Findings   Reproductive/Obstetrics negative OB ROS                           Anesthesia Physical Anesthesia Plan  ASA: I  Anesthesia Plan: General   Post-op Pain Management:    Induction: Intravenous  Airway Management Planned: Oral ETT  Additional Equipment:   Intra-op Plan:   Post-operative Plan: Extubation in OR  Informed Consent: I have reviewed the patients History and Physical, chart, labs and discussed the procedure including the risks, benefits and alternatives for the proposed anesthesia with the patient or authorized representative who has indicated his/her understanding and acceptance.     Plan Discussed with: CRNA and Surgeon  Anesthesia Plan Comments:         Anesthesia Quick Evaluation

## 2013-10-21 NOTE — Transfer of Care (Signed)
Immediate Anesthesia Transfer of Care Note  Patient: Austin Schmidt  Procedure(s) Performed: Procedure(s): CYST EXCISION PILONIDAL SIMPLE (N/A)  Patient Location: PACU  Anesthesia Type:General  Level of Consciousness: sedated and patient cooperative  Airway & Oxygen Therapy: Patient Spontanous Breathing and Patient connected to face mask oxygen  Post-op Assessment: Report given to PACU RN and Post -op Vital signs reviewed and stable  Post vital signs: Reviewed and stable  Complications: No apparent anesthesia complications

## 2013-10-21 NOTE — H&P (Signed)
Transplants    None    Demographics Austin Schmidt 23 year old male  [address]  [facility] [place] [address] [phone] [name])  Comm Pref: None [address]  [place] [address] [phone] (M) Works at OTHER [[employer]  Problem ListHospitalization ProblemNon-Hospital  Pilonidal cyst  Lymphadenopathy, supraclavicular  Significant History/Details  Smoking: Current Every Day Smoker, .5 ppd, 2 pack-years  Smokeless Tobacco: Never Used  Alcohol: No  3 open orders  Preferred Language: English  Dialysis HistoryNone   Currently admitted as of 2/24/2015Specialty CommentsEditShow AllReport08/29/2014:MAY RELEASE MEDICAL INFO TO Austin \\RENA  Schmidt MOTHER,Austin Schmidt FATHER 08/05/13 pt will call back to schedule when has deposit placed in pending folder/et 10/01/13 left message for pt to call me back to schedule sx. skm DOS: 10/21/13 TC-[facility]-OP-exc pilonidal cyst/gen [id number] [facility]  10-09-13 pt op sx sch'd 10-21-13 @ [facility] no precert required per Charlcie Cradle. Ref# [id number]. (kh,tvp)   Medications (Discharged within 1 Day(s))Hospital Medications Outpatient Medications  ceFAZolin (ANCEF) 3 g in dextrose 5 % 50 mL IVPB  chlorhexidine (HIBICLENS) 4 % liquid 1 application  fentaNYL (SUBLIMAZE) injection 50-100 mcg  lactated ringers infusion  midazolam (VERSED) 2 MG/ML syrup 12 mg  midazolam (VERSED) injection 1-2 mg    Preferred Labs   None   Transplant-Related Biopsies (11 years) ** None **  Patient Blood Type (50 years)   None                                 Recent Visits (Maximum of 10 visits)Date Type Provider Description  10/21/2013 Surgery Arora Coakley A., MD   08/05/2013 Office Visit Austin Schmidt A., MD Pilonidal Cyst (Primary Dx)  06/30/2013 Office Visit Austin Schmidt A., MD Lymphadenopathy, Supraclavicular (Primary Dx); Pilonidal Cys...  05/22/2013 Office Visit Stan Cantave A., MD Lymphadenopathy, Supraclavicular (Primary Dx); Pilonidal Cys...   04/25/2013 Office Visit Robben Jagiello A., MD Pilonidal Cyst (Primary Dx); Lymphadenopathy, Supraclavicula...         My Last Outpatient Progress NoteStatus Last Austin Schmidt Encounter Date  Signed Tue Aug 05, 2013 5:19 PM EST 08/05/2013  Patient ID: Austin Schmidt, male   DOB: 1990-09-12, 23 y.o.   MRN: [id number]    Chief Complaint   Patient presents with   .  Follow-up      HPI Austin Schmidt is a 23 y.o. male.  Patient sent at the request of Dr. [place] for pilonidal disease and enlarged left supraclavicular node.   No cough or recent infections. No weight loss.  Here for 1 month follow up.  No new complaints. Trying to stop smoking. No new complaints.  HPI   History reviewed. No pertinent past medical history.   History reviewed. No pertinent past surgical history.   History reviewed. No pertinent family history.   Social History History   Substance Use Topics   .  Smoking status:  Former Smoker -- 1.00 packs/day       Quit date:  06/16/2013   .  Smokeless tobacco:  Never Used   .  Alcohol Use:  No      No Known Allergies    No current outpatient prescriptions on file.       No current facility-administered medications for this visit.      Review of Systems Review of Systems  Constitutional: Negative for fever.  HENT: Negative.   Eyes: Negative.   Respiratory: Negative.   Cardiovascular: Negative.   Gastrointestinal: Negative.  Endocrine: Negative.   Genitourinary: Negative.   Allergic/Immunologic: Negative.   Neurological: Negative.   Hematological: Positive for adenopathy.  Psychiatric/Behavioral: Negative.       Blood pressure 127/80, pulse 78, temperature 99.1 F (37.3 C), temperature source Temporal, resp. rate 18, height 6\' 1"  (1.854 m), weight 186 lb 6.4 oz (84.55 kg).   Physical Exam Physical Exam  Constitutional: He is oriented to person, place, and time. He appears well-developed and well-nourished.  HENT:   Head: Normocephalic and  atraumatic.  Eyes: EOM are normal. Pupils are equal, round, and reactive to light.  Pulmonary/Chest: Effort normal.  Musculoskeletal: Normal range of motion.  Lymphadenopathy:    He has no cervical adenopathy.       Left: Supraclavicular adenopathy resolved 0.5 cm in size stable to slightly smaller. Neurological: He is alert and oriented to person, place, and time.  Skin:  2 x 3 cm pilonidal cyst not infected  Psychiatric: He has a normal mood and affect. His behavior is normal. Judgment normal.      Data Reviewed Dr Virginia CrewsBetty Jordan's notes   Assessment Pilonidal cyst with history of infection Supraclavicular adenopathy left mild   Plan Pilonidal cyst  Not infected wants to set up surgery.  The procedure has been discussed with the patient.  Alternative therapies have been discussed with the patient.  Operative risks include bleeding,  Infection,  Organ injury,  Nerve injury,  Blood vessel injury,  DVT,  Pulmonary embolism,  Death, poor wound healing and reoperation.   Medical management risks include worsening of present situation.  The success of the procedure is 50 -90 % at treating patients symptoms.  The patient understands and agrees to proceed. Mild lymphadenopathy left supraclavicular area  resolved Recommend smoking cessation if he wants pilonidal removed.         Wylodean Shimmel A.

## 2013-10-21 NOTE — Interval H&P Note (Signed)
History and Physical Interval Note:  10/21/2013 1:27 PM  Austin Schmidt  has presented today for surgery, with the diagnosis of pilonidal cyst   The various methods of treatment have been discussed with the patient and family. After consideration of risks, benefits and other options for treatment, the patient has consented to  Procedure(s): CYST EXCISION PILONIDAL SIMPLE (N/A) as a surgical intervention .  The patient's history has been reviewed, patient examined, no change in status, stable for surgery.  I have reviewed the patient's chart and labs.  Questions were answered to the patient's satisfaction.     [name] A.

## 2013-10-21 NOTE — Anesthesia Postprocedure Evaluation (Signed)
  Anesthesia Post-op Note  Patient: Austin Schmidt  Procedure(s) Performed: Procedure(s): CYST EXCISION PILONIDAL SIMPLE (N/A)  Patient Location: PACU  Anesthesia Type:General  Level of Consciousness: awake and alert   Airway and Oxygen Therapy: Patient Spontanous Breathing  Post-op Pain: mild  Post-op Assessment: Post-op Vital signs reviewed  Post-op Vital Signs: stable  Complications: No apparent anesthesia complications

## 2013-10-21 NOTE — Anesthesia Procedure Notes (Signed)
Procedure Name: Intubation Date/Time: 10/21/2013 1:55 PM Performed by: Curly ShoresRAFT, Diem Pagnotta W Pre-anesthesia Checklist: Patient identified, Emergency Drugs available, Suction available and Patient being monitored Patient Re-evaluated:Patient Re-evaluated prior to inductionOxygen Delivery Method: Circle System Utilized Preoxygenation: Pre-oxygenation with 100% oxygen Intubation Type: IV induction Ventilation: Mask ventilation without difficulty Laryngoscope Size: Miller and 2 Grade View: Grade I Tube type: Oral Tube size: 8.0 mm Number of attempts: 1 Airway Equipment and Method: stylet Placement Confirmation: ETT inserted through vocal cords under direct vision,  positive ETCO2 and breath sounds checked- equal and bilateral Secured at: 22 cm Tube secured with: Tape Dental Injury: Teeth and Oropharynx as per pre-operative assessment

## 2013-10-21 NOTE — Discharge Instructions (Signed)
Pilonidal Cyst A pilonidal cyst occurs when hairs get trapped (ingrown) beneath the skin in the crease between the buttocks over your sacrum (the bone under that crease). Pilonidal cysts are most common in young men with a lot of body hair. When the cyst is ruptured (breaks) or leaking, fluid from the cyst may cause burning and itching. If the cyst becomes infected, it causes a painful swelling filled with pus (abscess). The pus and trapped hairs need to be removed (often by lancing) so that the infection can heal. However, recurrence is common and an operation may be needed to remove the cyst. HOME CARE INSTRUCTIONS   If the cyst was NOT INFECTED:  Keep the area clean and dry. Bathe or shower daily. Wash the area well with a germ-killing soap. Warm tub baths may help prevent infection and help with drainage. Dry the area well with a towel.  Avoid tight clothing to keep area as moisture free as possible.  Keep area between buttocks as free of hair as possible. A depilatory may be used.  If the cyst WAS INFECTED and needed to be drained:  Your caregiver packed the wound with gauze to keep the wound open. This allows the wound to heal from the inside outwards and continue draining.  Return for a wound check in 1 day or as suggested.  If you take tub baths or showers, repack the wound with gauze following them. Sponge baths (at the sink) are a good alternative.  If an antibiotic was ordered to fight the infection, take as directed.  Only take over-the-counter or prescription medicines for pain, discomfort, or fever as directed by your caregiver.  After the drain is removed, use sitz baths for 20 minutes 4 times per day. Clean the wound gently with mild unscented soap, pat dry, and then apply a dry dressing. SEEK MEDICAL CARE IF:   You have increased pain, swelling, redness, drainage, or bleeding from the area.  You have a fever.  You have muscles aches, dizziness, or a general ill  feeling. Document Released: 08/11/2000 Document Revised: 11/06/2011 Document Reviewed: 10/09/2008 [facility] Patient Information 2014 [place], [place].    REMOVE PACKING IN 48 HOURS AND REPACK WITH GAUZE.  CHANGE GAUZE  EVERY 48 HOURS .  COVER WITH DRY PAD .  OK TO SHOWER DAILY.  PLACE NEOSPORIN OVER INCISION. RETURN TO OFFICE IN 2 WEEKS.   Pilonidal Cyst, Care After A pilonidal cyst occurs when hairs get trapped (ingrown) beneath the skin in the crease between the buttocks over your sacrum (the bone under that crease). Pilonidal cysts are most common in young men with a lot of body hair. When the cyst breaks(ruptured) or leaks, fluid from the cyst may cause burning and itching. If the cyst becomes infected, it causes a painful swelling filled with pus (abscess). The pus and trapped hairs need to be removed (often by lancing) so that the infection can heal. The word pilonidal means hair nest. HOME CARE INSTRUCTIONS If the pilonidal sinus was NOT DRAINING OR LANCED:  Keep the area clean and dry. Bathe or shower daily. Wash the area well with a germ-killing soap. Hot tub baths may help prevent infection. Dry the area well with a towel.  Avoid tight clothing in order to keep area as moisture-free as possible.  Keep area between buttocks as free from hair as possible. A depilatory may be used.  Take antibiotics as directed.  Only take over-the-counter or prescription medicines for pain, discomfort, or fever as directed  by your caregiver. If the cyst WAS INFECTED AND NEEDED TO BE DRAINED:  Your caregiver may have packed the wound with gauze to keep the wound open. This allows the wound to heal from the inside outward and continue to drain.  Return as directed for a wound check.  If you take tub baths or showers, repack the wound with gauze as directed following. Sponge baths are a good alternative. Sitz baths may be used three to four times a day or as directed.  If an antibiotic was ordered to  fight the infection, take as directed.  Only take over-the-counter or prescription medicines for pain, discomfort, or fever as directed by your caregiver.  If a drain was in place and removed, use sitz baths for 20 minutes 4 times per day. Clean the wound gently with mild unscented soap, pat dry, and then apply a dry dressing as directed. If you had surgery and IT WAS MARSUPIALIZED (LEFT OPEN):  Your wound was packed with gauze to keep the wound open. This allows the wound to heal from the inside outwards and continue draining. The changing of the dressing regularly also helps keep the wound clean.  Return as directed for a wound check.  If you take tub baths or showers, repack the wound with gauze as directed following. Sponge baths are a good alternative. Sitz baths can also be used. This may be done three to four times a day or as directed.  If an antibiotic was ordered to fight the infection, take as directed.  Only take over-the-counter or prescription medicines for pain, discomfort, or fever as directed by your caregiver.  If you had surgery and the wound was closed you may care for it as directed. This generally includes keeping it dry and clean and dressing it as directed. SEEK MEDICAL CARE IF:   You have increased pain, swelling, redness, drainage, or bleeding from the area.  You have a fever.  You have muscles aches, dizziness, or a general ill feeling. Document Released: 09/14/2006 Document Revised: 04/16/2013 Document Reviewed: 11/29/2006 [facility] Patient Information 2014 [place].    GENERAL SURGERY: POST OP INSTRUCTIONS  1. DIET: Follow a light bland diet the first 24 hours after arrival home, such as soup, liquids, crackers, etc.  Be sure to include lots of fluids daily.  Avoid fast food or heavy meals as your are more likely to get nauseated.   2. Take your usually prescribed home medications unless otherwise directed. 3. PAIN CONTROL: a. Pain is best  controlled by a usual combination of three different methods TOGETHER: i. Ice/Heat ii. Over the counter pain medication iii. Prescription pain medication b. Most patients will experience some swelling and bruising around the incisions.  Ice packs or heating pads (30-60 minutes up to 6 times a day) will help. Use ice for the first few days to help decrease swelling and bruising, then switch to heat to help relax tight/sore spots and speed recovery.  Some people prefer to use ice alone, heat alone, alternating between ice & heat.  Experiment to what works for you.  Swelling and bruising can take several weeks to resolve.   c. It is helpful to take an over-the-counter pain medication regularly for the first few weeks.  Choose one of the following that works best for you: i. Naproxen (Aleve, etc)  Two 220mg  tabs twice a day ii. Ibuprofen (Advil, etc) Three 200mg  tabs four times a day (every meal & bedtime) iii. Acetaminophen (Tylenol, etc) 500-650mg  four  times a day (every meal & bedtime) d. A  prescription for pain medication (such as oxycodone, hydrocodone, etc) should be given to you upon discharge.  Take your pain medication as prescribed.  i. If you are having problems/concerns with the prescription medicine (does not control pain, nausea, vomiting, rash, itching, etc), please call us [phone] to see if we need to switch you to a different pain medicine that will work better for you and/or control your side effect better. ii. If you need a refill on your pain medication, please contact your pharmacy.  They will contact our office to request authorization. Prescriptions will not be filled after 5 pm or on week-ends. 4. Avoid getting constipated.  Between the surgery and the pain medications, it is common to experience some constipation.  Increasing fluid intake and taking a fiber supplement (such as Metamucil, Citrucel, FiberCon, MiraLax, etc) 1-2 times a day regularly will usually help prevent  this problem from occurring.  A mild laxative (prune juice, Milk of Magnesia, MiraLax, etc) should be taken according to package directions if there are no bowel movements after 48 hours.   5. Wash / shower every day.  You may shower over the dressings as they are waterproof.  Continue to shower over incision(s) after the dressing is off. 6. Remove your waterproof bandages 5 days after surgery.  You may leave the incision open to air.  You may have skin tapes (Steri Strips) covering the incision(s).  Leave them on until one week, then remove.  You may replace a dressing/Band-Aid to cover the incision for comfort if you wish.      7. ACTIVITIES as tolerated:   a. You may resume regular (light) daily activities beginning the next day--such as daily self-care, walking, climbing stairs--gradually increasing activities as tolerated.  If you can walk 30 minutes without difficulty, it is safe to try more intense activity such as jogging, treadmill, bicycling, low-impact aerobics, swimming, etc. b. Save the most intensive and strenuous activity for last such as sit-ups, heavy lifting, contact sports, etc  Refrain from any heavy lifting or straining until you are off narcotics for pain control.   c. DO NOT PUSH THROUGH PAIN.  Let pain be your guide: If it hurts to do something, don't do it.  Pain is your body warning you to avoid that activity for another week until the pain goes down. d. You may drive when you are no longer taking prescription pain medication, you can comfortably wear a seatbelt, and you can safely maneuver your car and apply brakes. e. Bonita Quin may have sexual intercourse when it is comfortable.  8. FOLLOW UP in our office a. Please call [facility] at [phone] to set up an appointment to see your surgeon in the office for a follow-up appointment approximately 2-3 weeks after your surgery. b. Make sure that you call for this appointment the day you arrive home to insure a convenient appointment  time. 9. IF YOU HAVE DISABILITY OR FAMILY LEAVE FORMS, BRING THEM TO THE OFFICE FOR PROCESSING.  DO NOT GIVE THEM TO YOUR DOCTOR.   WHEN TO CALL us [phone]: 1. Poor pain control 2. Reactions / problems with new medications (rash/itching, nausea, etc)  3. Fever over 101.5 F (38.5 C) 4. Worsening swelling or bruising 5. Continued bleeding from incision. 6. Increased pain, redness, or drainage from the incision 7. Difficulty breathing / swallowing   The clinic staff is available to answer your questions during regular business hours (  8:30am-5pm).  Please dont hesitate to call and ask to speak to one of our nurses for clinical concerns.   If you have a medical emergency, go to the nearest emergency room or call 911.  A surgeon from [facility] is always on call at the [facility], [place] [address], [address], [place], [place]  [address] ? MAIN: [phone] ? TOLL FREE: [phone] ?  FAX [phone] [email or web address]   Post Anesthesia Home Care Instructions  Activity: Get plenty of rest for the remainder of the day. A responsible adult should stay with you for 24 hours following the procedure.  For the next 24 hours, DO NOT: -Drive a car -Advertising copywriter -Drink alcoholic beverages -Take any medication unless instructed by your physician -Make any legal decisions or sign important papers.  Meals: Start with liquid foods such as gelatin or soup. Progress to regular foods as tolerated. Avoid greasy, spicy, heavy foods. If nausea and/or vomiting occur, drink only clear liquids until the nausea and/or vomiting subsides. Call your physician if vomiting continues.  Special Instructions/Symptoms: Your throat may feel dry or sore from the anesthesia or the breathing tube placed in your throat during surgery. If this causes discomfort, gargle with warm salt water. The discomfort should disappear within 24 hours.

## 2013-10-21 NOTE — Brief Op Note (Signed)
10/21/2013  2:33 PM  PATIENT:  Austin Schmidt  23 y.o. male  PRE-OPERATIVE DIAGNOSIS:  pilonidal cyst   POST-OPERATIVE DIAGNOSIS:  pilonidal cyst   PROCEDURE:  Procedure(s): CYST EXCISION PILONIDAL SIMPLE (N/A)  SURGEON:  Surgeon(s) and Role:    * Tamsen Reist A. Ronnisha Felber, MD - Primary  PHYSICIAN ASSISTANT:   A  ANESTHESIA:   local and general  EBL:  Total I/O In: 200 [I.V.:200] Out: -    LOCAL MEDICATIONS USED:  BUPIVICAINE   SPECIMEN:  Source of Specimen:  pilonidal cyst  DISPOSITION OF SPECIMEN:  PATHOLOGY  COUNTS:  YES  TOURNIQUET:  * No tourniquets in log *  DICTATION: .Other Dictation: Dictation Number [id number]  PLAN OF CARE: Discharge to home after PACU  PATIENT DISPOSITION:  PACU - hemodynamically stable.   Delay start of Pharmacological VTE agent (>24hrs) due to surgical blood loss or risk of bleeding: not applicable

## 2013-10-22 ENCOUNTER — Encounter (HOSPITAL_BASED_OUTPATIENT_CLINIC_OR_DEPARTMENT_OTHER): Payer: Self-pay | Admitting: Surgery

## 2013-10-22 NOTE — Op Note (Signed)
NAMECayne, Yom NO.:  [id number]  MEDICAL RECORD NO.:  [id number]  LOCATION:                                 FACILITY:  PHYSICIAN:  Marcello Moores A. [place] Giammona, M.D.     DATE OF BIRTH:  DATE OF PROCEDURE:  10/21/2013 DATE OF DISCHARGE:                              OPERATIVE REPORT   PREOPERATIVE DIAGNOSIS:  Complex pilonidal cyst.  POSTOPERATIVE DIAGNOSIS:  Complex pilonidal cyst.  PROCEDURE:  Excision of complex pilonidal cyst with 3-cm intermediate closure of complex wound.  SURGEON:  Marcello Moores A. Amiria Orrison, M.D.  ANESTHESIA:  General endotracheal anesthesia with 0.25% Sensorcaine local with epinephrine.  ESTIMATED BLOOD LOSS:  Minimal.  SPECIMENS:  Pilonidal cyst as well as sinus tract to Pathology.  DRAINS:  Quarter-inch packing used for the wound.  INDICATIONS FOR PROCEDURE:  The patient is a 23 year old male with a complex pilonidal cyst.  He had 3 sinus tracts in the midline and then 1 off the patient's right upper buttock.  He wished excision since these have been infected causing discomfort off and on.  Risk, benefits, and alternatives to surgery discussed.  Risk of bleeding, infection, poor wound healing, open wound, the need for surgery if recurrence, pain, injury to neighboring structures, and the need for further operative procedures.  He voices understanding and agrees to proceed.  DESCRIPTION OF PROCEDURE:  The patient was met in the holding area and questions were answered.  After discussion of the procedure as well as expectations afterwards, he was brought back to the operating room.  He was then intubated and placed prone on the OR table.  The buttocks were taped apart.  He was appropriately padded.  Inner gluteal cleft was prepped and draped in a sterile fashion.  Time-out was done.  He received 2 g of Ancef, broke out in a rash, this was stopped.  After time-out was done, we used 0.25% Sensorcaine local anesthesia, we infiltrated this  around the wound site.  He had a small 3 cm area of pits in the midline.  He then had a sinus tract that was at least 5 cm off the midline superior left buttock.  Probe was used in this tract into the pilonidal cyst cavity itself.  Curvilinear incision was made around the 3 cm tract of pits.  I used a probe to define the sinus tract from the left buttock.  This went all the way into this pilonidal region.  I went ahead and kept the skin intact, but excised the entire tract all around the probe down to the pilonidal into the cavity from the excision of the 3 pits.  All of this was removed.  I cauterized the cavity and the sinus tract extensively.  There was a separate area on the patient's left buttock that did not tract, that I just opened this up and it tunneled for about 1 cm and we opened up this tunnel tract. This stopped and it was blind ended.  This was left open after cauterizing and destroyed the tract.  Once irrigation was used, I mobilized the skin off the subcutaneous fat to obliterate the gluteal cleft.  This was done with intermediate closure using #1 Vicryl.  We pulled the areas together.  Hemostasis was achieved.  I used 4-0 Monocryl to close the skin in a subcuticular fashion.  The sinus tract was left open and quarter-inch iodoform packing was placed down the tract.  Neosporin placed on the sinus tract and open wound.  Dry dressings were applied.  Packing was removed in 48 hours.  The packing was given to family to repack this area.  All final counts of sponge, needle, and instruments were found to be correct at this portion of the case.  The patient was awoke, extubated, taken to recovery in a satisfactory condition.     Kailena Lubas A. Alysiana Ethridge, M.D.     TAC/MEDQ  D:  10/21/2013  T:  10/22/2013  Job:  [id number]

## 2013-11-03 ENCOUNTER — Encounter (INDEPENDENT_AMBULATORY_CARE_PROVIDER_SITE_OTHER): Payer: Self-pay | Admitting: Surgery

## 2013-11-03 ENCOUNTER — Ambulatory Visit (INDEPENDENT_AMBULATORY_CARE_PROVIDER_SITE_OTHER): Payer: 59 | Admitting: Surgery

## 2013-11-03 VITALS — BP 128/78 | HR 77 | Temp 98.8°F | Resp 14 | Ht 73.0 in | Wt 195.0 lb

## 2013-11-03 DIAGNOSIS — Z9889 Other specified postprocedural states: Secondary | ICD-10-CM

## 2013-11-03 NOTE — Progress Notes (Signed)
Patient returns after pilonidal cystectomy 12 days ago. He is doing well. His packing is removed.  Exam: Incision to gluteal cleft is healing well. Open area were packing was removed. Open area above this is clean. No evidence of infection or seroma.  Impression: Status post pilonidal cyst removal with intermediate closure  Plan: Return in 2 weeks. Continue local wound care. Continue out of work until he returns

## 2013-11-03 NOTE — Patient Instructions (Signed)
RETURN 2 WEEKS.  OOW UNTIL YOU RETURN.

## 2013-11-17 ENCOUNTER — Ambulatory Visit (INDEPENDENT_AMBULATORY_CARE_PROVIDER_SITE_OTHER): Payer: 59 | Admitting: Surgery

## 2013-11-17 ENCOUNTER — Encounter (INDEPENDENT_AMBULATORY_CARE_PROVIDER_SITE_OTHER): Payer: Self-pay | Admitting: Surgery

## 2013-11-17 VITALS — BP 132/80 | HR 77 | Temp 98.5°F | Ht 73.0 in | Wt 195.0 lb

## 2013-11-17 DIAGNOSIS — Z9889 Other specified postprocedural states: Secondary | ICD-10-CM

## 2013-11-17 DIAGNOSIS — L0591 Pilonidal cyst without abscess: Secondary | ICD-10-CM

## 2013-11-17 NOTE — Patient Instructions (Signed)
Neosporin to wound daily.   Wear pad to keep it dry.

## 2013-11-17 NOTE — Progress Notes (Signed)
Patient returns after pilonidal cystectomy 12 days ago. He is doing well. His packing is removed.  Exam: Incision to gluteal cleft with  Open area  is clean 1.5 cm x 1.5 cm x 0.5 cm  No evidence of infection or seroma.  Impression: Status post pilonidal cyst removal with intermediate closure  Plan: Return in 4 weeks. Continue local wound care. Return to work. Apply neosporin to wound.

## 2013-12-16 ENCOUNTER — Ambulatory Visit (INDEPENDENT_AMBULATORY_CARE_PROVIDER_SITE_OTHER): Payer: 59 | Admitting: Surgery

## 2013-12-16 ENCOUNTER — Encounter (INDEPENDENT_AMBULATORY_CARE_PROVIDER_SITE_OTHER): Payer: Self-pay | Admitting: Surgery

## 2013-12-16 VITALS — BP 128/80 | HR 80 | Temp 97.2°F | Resp 14 | Ht 73.0 in | Wt 201.0 lb

## 2013-12-16 DIAGNOSIS — Z9889 Other specified postprocedural states: Secondary | ICD-10-CM | POA: Insufficient documentation

## 2013-12-16 NOTE — Progress Notes (Signed)
Patient returns after pilonidal cystectomy 32 days ago. He is doing well. His packing is removed.  Exam: Incision to gluteal cleft with  Open area  is clean 1.5 cm x 1.5 cm x 0.5 cm  No evidence of infection or seroma.  Impression: Status post pilonidal cyst removal with intermediate closure  Plan: Return in 6 weeks. Continue local wound care. Return to work. Apply neosporin to wound.

## 2013-12-16 NOTE — Patient Instructions (Signed)
Return 6 weeks

## 2014-01-26 ENCOUNTER — Encounter (INDEPENDENT_AMBULATORY_CARE_PROVIDER_SITE_OTHER): Payer: 59 | Admitting: Surgery

## 2014-03-02 ENCOUNTER — Encounter (INDEPENDENT_AMBULATORY_CARE_PROVIDER_SITE_OTHER): Payer: Self-pay | Admitting: Surgery

## 2014-03-02 ENCOUNTER — Ambulatory Visit (INDEPENDENT_AMBULATORY_CARE_PROVIDER_SITE_OTHER): Payer: 59 | Admitting: Surgery

## 2014-03-02 VITALS — BP 130/78 | HR 80 | Temp 98.1°F | Ht 73.0 in | Wt 198.0 lb

## 2014-03-02 DIAGNOSIS — Z872 Personal history of diseases of the skin and subcutaneous tissue: Secondary | ICD-10-CM

## 2014-03-02 NOTE — Patient Instructions (Signed)
Follow up as needed

## 2014-03-02 NOTE — Progress Notes (Signed)
Patient returns after pilonidal cystectomy 6 months  ago. He is doing well. His packing is removed.  Exam: Incision to gluteal cleft with  Open area  is clean 0.5 cm x 0..5 cm x 0.5 cm  No evidence of infection or seroma.  Impression: Hx of pilonidal cyst removal with intermediate closure 95 % closed.   Plan: Small area should close in next 1 - 2 months. Apply neosporin to wound.

## 2015-04-10 IMAGING — CR DG CHEST 2V
2 series · 2 of 2 positions shown · non-contrast
Comparison: None.

CLINICAL DATA: Preoperative films.

EXAM:
CHEST  2 VIEW

[w chest pa]
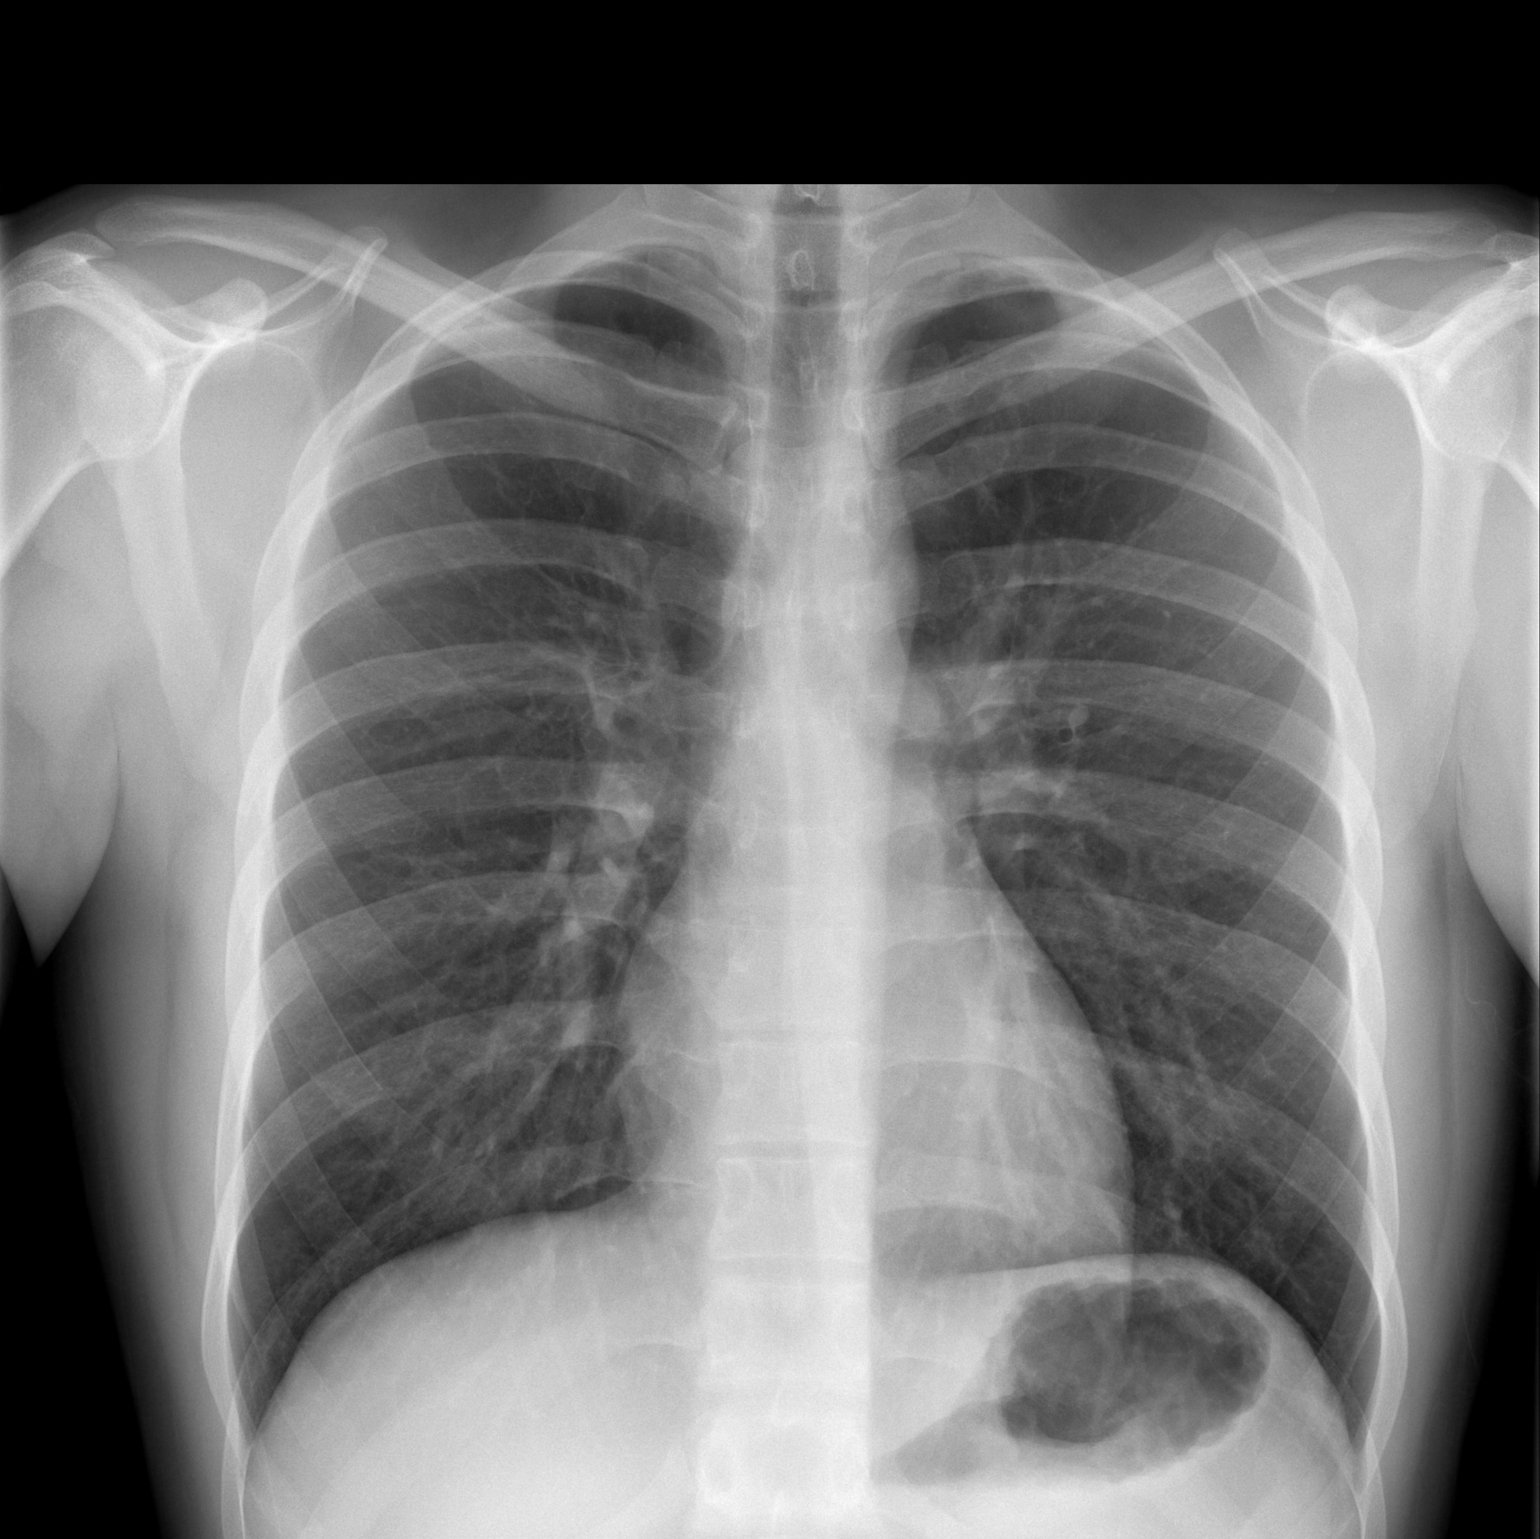

[w chest lat]
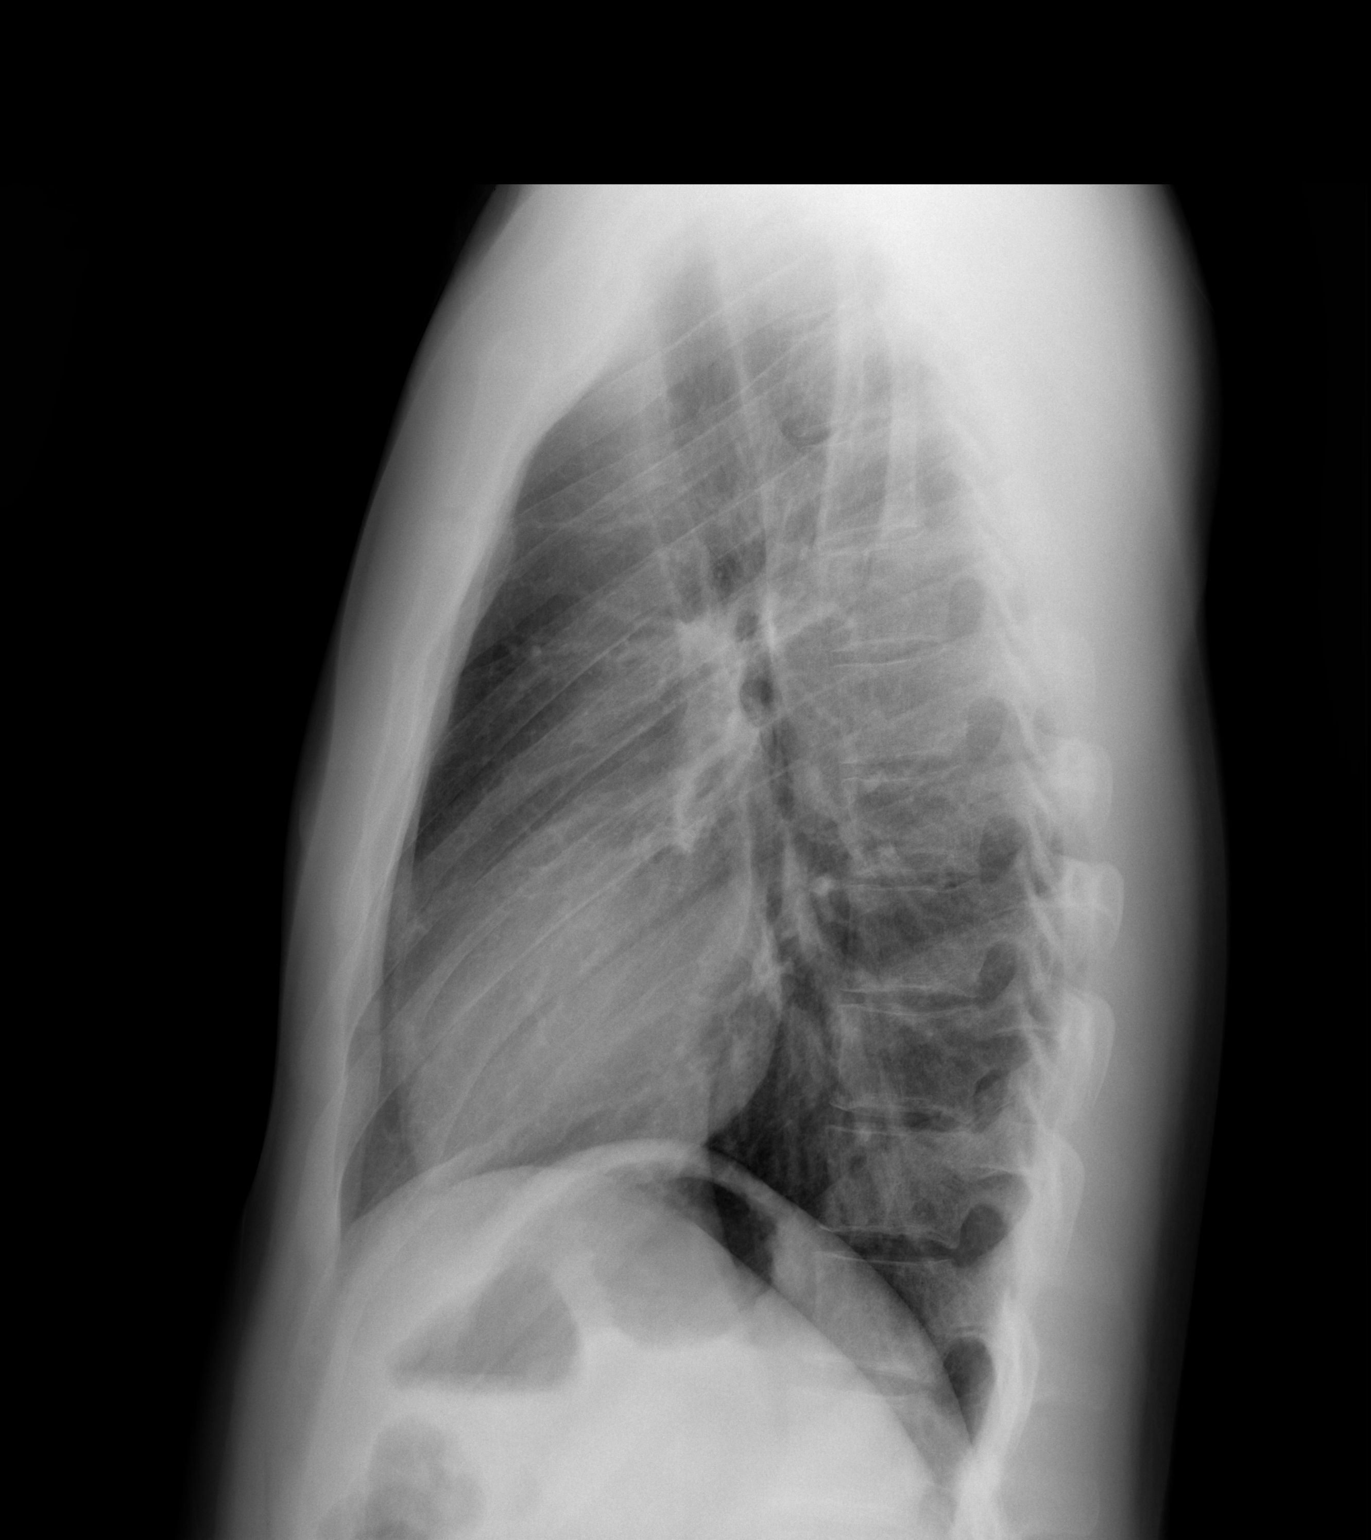

[2 of 2 positions shown; findings below may reference images not displayed]

FINDINGS: Heart size and mediastinal contours are within normal limits. Both
lungs are clear. Visualized skeletal structures are unremarkable.
IMPRESSION: Negative exam.
